# Patient Record
Sex: Male | Born: 1975 | Race: White | Hispanic: No | Marital: Married | Smoking: Never smoker
Health system: Southern US, Community
[De-identification: ages and names within clinical notes are randomized; demographics above are authoritative.]

---

## 2019-05-19 ENCOUNTER — Emergency Department: Payer: Managed Care, Other (non HMO)

## 2019-05-19 ENCOUNTER — Other Ambulatory Visit: Payer: Self-pay

## 2019-05-19 ENCOUNTER — Emergency Department
Admission: EM | Admit: 2019-05-19 | Discharge: 2019-05-19 | Disposition: A | Discharge status: Home / Self Care | Payer: Managed Care, Other (non HMO) | Attending: Emergency Medicine | Admitting: Emergency Medicine

## 2019-05-19 DIAGNOSIS — S82821A Torus fracture of lower end of right fibula, initial encounter for closed fracture: Secondary | ICD-10-CM | POA: Insufficient documentation

## 2019-05-19 DIAGNOSIS — X501XXA Overexertion from prolonged static or awkward postures, initial encounter: Secondary | ICD-10-CM | POA: Diagnosis not present

## 2019-05-19 DIAGNOSIS — R52 Pain, unspecified: Secondary | ICD-10-CM

## 2019-05-19 DIAGNOSIS — Y929 Unspecified place or not applicable: Secondary | ICD-10-CM | POA: Insufficient documentation

## 2019-05-19 DIAGNOSIS — S99911A Unspecified injury of right ankle, initial encounter: Secondary | ICD-10-CM | POA: Diagnosis present

## 2019-05-19 DIAGNOSIS — Y93K1 Activity, walking an animal: Secondary | ICD-10-CM | POA: Diagnosis not present

## 2019-05-19 DIAGNOSIS — Y999 Unspecified external cause status: Secondary | ICD-10-CM | POA: Diagnosis not present

## 2019-05-19 MED ORDER — IBUPROFEN 600 MG PO TABS
600.0000 mg | ORAL_TABLET | Freq: Once | ORAL | Status: AC
  Administered 2019-05-19: 600 mg via ORAL
  Filled 2019-05-19: qty 1

## 2019-05-19 MED ORDER — HYDROCODONE-ACETAMINOPHEN 5-325 MG PO TABS: 1.0000 | ORAL_TABLET | Freq: Four times a day (QID) | ORAL | 0 refills | Status: AC | PRN

## 2019-05-19 NOTE — ED Triage Notes (Signed)
Pt to the er for pain to the right ankle. Pt states he heard a pop and rolled the ankle underneath him.

## 2019-05-19 NOTE — ED Notes (Signed)
See triage note states he slipped and twisted right ankle  Swelling noted   Good pulses  No deformity

## 2019-05-19 NOTE — Discharge Instructions (Signed)
Follow-up with the orthopedist of choice in [place].  A prescription for pain medication was sent to your pharmacy.  You may also take ibuprofen with this medication if needed for extra pain control.  Ice and elevate often.  You will need to leave the splint on until seen by an orthopedist.  Use crutches to avoid weightbearing.

## 2019-05-19 NOTE — ED Provider Notes (Signed)
[facility] Emergency Department Provider Note  ____________________________________________   First MD Initiated Contact with Patient 05/19/19 [phone]     (approximate)  I have reviewed the triage vital signs and the nursing notes.   HISTORY  Chief Complaint Ankle Pain   HPI Bradley Jenkins is a 44 y.o. male presents to the ED with complaint of right ankle pain.  Patient states that he was walking his dogs this morning down an incline and grass was possibly wet.  He was also wearing flip-flops.  He states he slipped twisting his right ankle.  Initially he heard a pop.  Patient has been limping since this happened.  He denies any head injury or loss of consciousness during this event.  Patient rates his pain as 6 out of 10.     History reviewed. No pertinent past medical history.  There are no problems to display for this patient.   History reviewed. No pertinent surgical history.  Prior to Admission medications   Medication Sig Start Date End Date Taking? Authorizing Provider  HYDROcodone-acetaminophen (NORCO/VICODIN) 5-325 MG tablet Take 1-2 tablets by mouth every 6 (six) hours as needed for moderate pain. 05/19/19   Johnn Hai, PA-C    Allergies Patient has no allergy information on record.  No family history on file.  Social History Social History   Tobacco Use  . Smoking status: Never Smoker  . Smokeless tobacco: Never Used  Substance Use Topics  . Alcohol use: Not Currently  . Drug use: Not Currently    Review of Systems Constitutional: No fever/chills Cardiovascular: Denies chest pain. Respiratory: Denies shortness of breath. Gastrointestinal: No abdominal pain.  No nausea, no vomiting.  Musculoskeletal: Right ankle pain. Skin: Negative for rash. Neurological: Negative for headaches, focal weakness or numbness. ____________________________________________   PHYSICAL EXAM:  VITAL SIGNS: ED Triage Vitals  Enc Vitals Group      BP 05/19/19 0626 (!) 128/98     Pulse Rate 05/19/19 0626 94     Resp 05/19/19 0626 17     Temp 05/19/19 0626 98 F (36.7 C)     Temp Source 05/19/19 0626 Oral     SpO2 05/19/19 0626 98 %     Weight 05/19/19 0627 280 lb (127 kg)     Height 05/19/19 0627 6' (1.829 m)     Head Circumference --      Peak Flow --      Pain Score 05/19/19 0631 6     Pain Loc --      Pain Edu? --      Excl. in [place]? --     Constitutional: Alert and oriented. Well appearing and in no acute distress. Eyes: Conjunctivae are normal. PERRL. EOMI. Head: Atraumatic. Nose: No trauma. Neck: No stridor.   Cardiovascular: Normal rate, regular rhythm. Grossly normal heart sounds.  Good peripheral circulation. Respiratory: Normal respiratory effort.  No retractions. Lungs CTAB. Gastrointestinal: Soft and nontender. No distention. Musculoskeletal: Moves upper extremities that any difficulty.  There was no tenderness on palpation of the cervical, thoracic or lumbar spine.  No tenderness is noted to the left lower extremity.  Right ankle with moderate tenderness on palpation.  Pulses present.  No tenderness or skin discoloration noted of the foot.  Patient is able to move all digits distally without any difficulty motor sensory function intact.  Nontender patella or hip to palpation. Neurologic:  Normal speech and language. No gross focal neurologic deficits are appreciated. Skin:  Skin is warm,  dry and intact.  No abrasions, ecchymosis or erythema noted. Psychiatric: Mood and affect are normal. Speech and behavior are normal.  ____________________________________________   LABS (all labs ordered are listed, but only abnormal results are displayed)  Labs Reviewed - No data to display  RADIOLOGY   Official radiology report(s): DG Ankle Complete Right  Result Date: 05/19/2019 CLINICAL DATA:  Right ankle pain, posttraumatic EXAM: RIGHT ANKLE - COMPLETE 3+ VIEW COMPARISON:  None. FINDINGS: Branching fracture  through the distal fibula at and above the ankle joint, nondisplaced. The medial clear space is widened at nearly 5 mm. No dislocation. Generalized soft tissue swelling. IMPRESSION: 1. Nondisplaced distal fibular fracture. 2. Medial clear space widening suggesting medial ligamentous injury. Electronically Signed   By: Marnee Spring M.D.   On: 05/19/2019 07:40    ____________________________________________   PROCEDURES  Procedure(s) performed (including Critical Care):  Procedures OCL splint and crutches were applied by ED tech.   ____________________________________________   INITIAL IMPRESSION / ASSESSMENT AND PLAN / ED COURSE  As part of my medical decision making, I reviewed the following data within the electronic MEDICAL RECORD NUMBER Notes from prior ED visits and [place] Controlled Substance Database  44 year old male presents to the ED after slipping and twisting his ankle this morning while walking the dogs.  Patient his here on vacation and was planning to go back to [facility] today.  Right ankle x-ray is suspicious for a fracture.  X-ray confirms that patient has a distal fibula fracture.  Patient was placed in an OCL stirrup and posterior splint for added protection.  Crutches were supplied.  Patient is aware that he needs to ice and elevate this frequently to keep it from swelling.  A CD of his x-rays was given to him to take to the orthopedist.  Ibuprofen was given to him while in the ED as he may end up driving because there are 2 vehicles in this area that need to go back to [facility].  We discussed that this may be difficult.  The prescription for Norco was sent to his pharmacy in [place].  He is to leave the splint on until he is been seen by the orthopedist and currently wife is calling an orthopedist to get an appointment.  ____________________________________________   FINAL CLINICAL IMPRESSION(S) / ED DIAGNOSES  Final diagnoses:  Closed torus fracture of  distal end of right fibula, initial encounter     ED Discharge Orders         Ordered    HYDROcodone-acetaminophen (NORCO/VICODIN) 5-325 MG tablet  Every 6 hours PRN     05/19/19 0809           Note:  This document was prepared using Dragon voice recognition software and may include unintentional dictation errors.    Tommi Rumps, PA-C 05/19/19 1450    Emily Filbert, MD 05/19/19 1524

## 2021-01-13 IMAGING — DX DG ANKLE COMPLETE 3+V*R*
3 series · 3 of 3 positions shown · non-contrast
Comparison: None.

CLINICAL DATA: Right ankle pain, posttraumatic

EXAM:
RIGHT ANKLE - COMPLETE 3+ VIEW

[ankle ap]
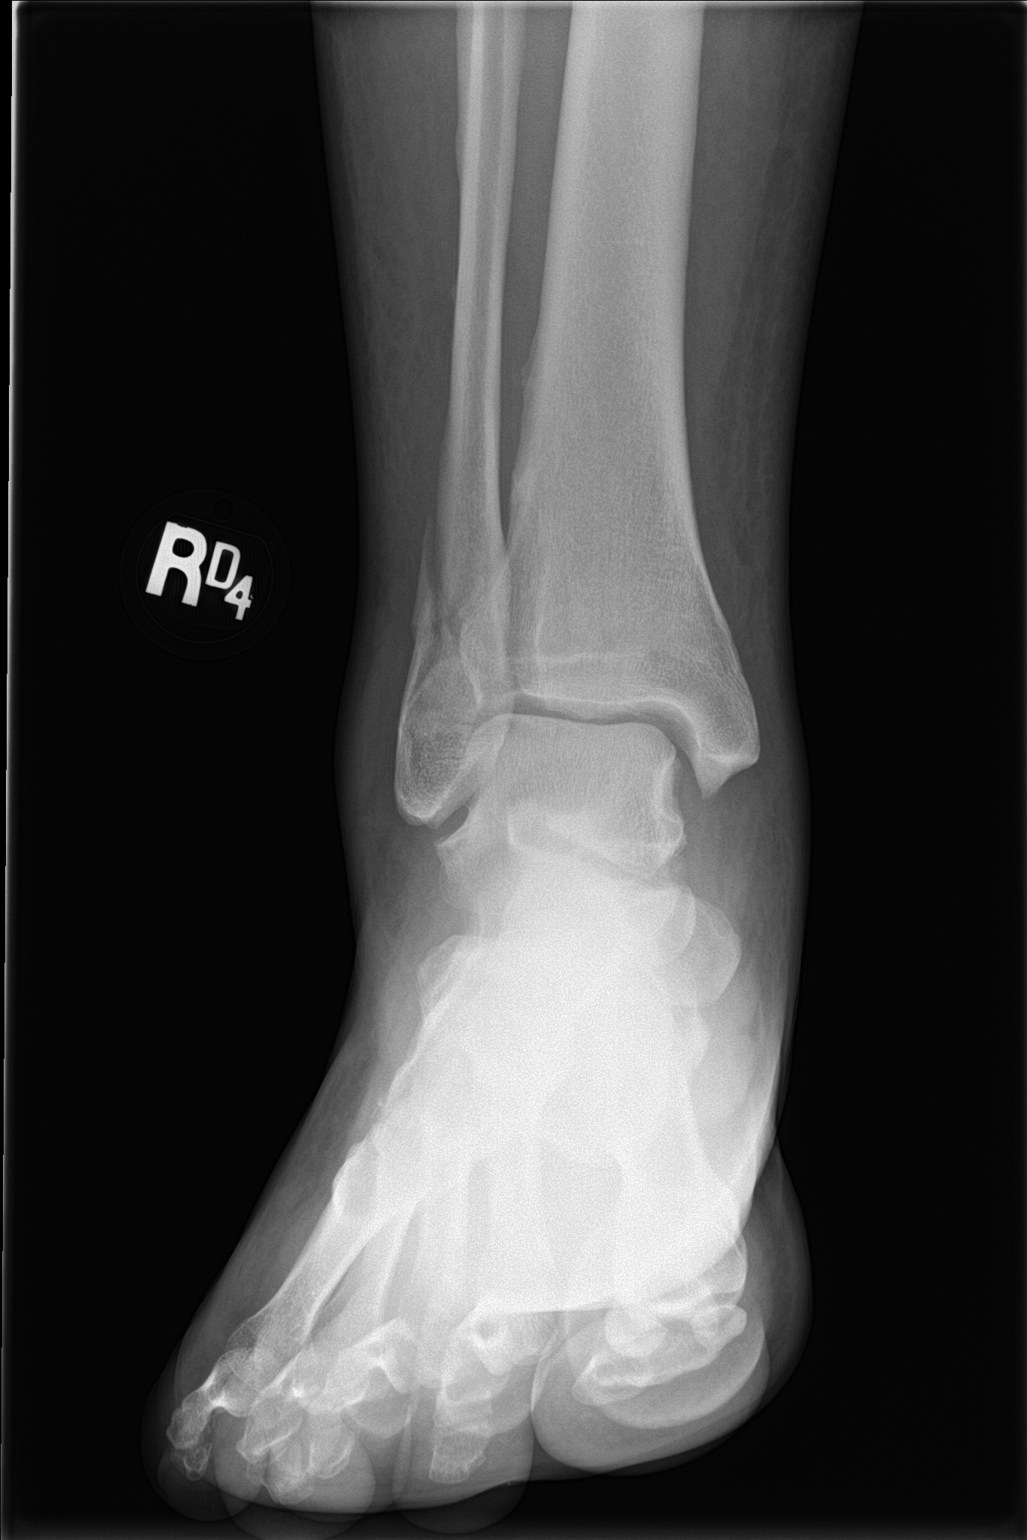

[ankle obl]
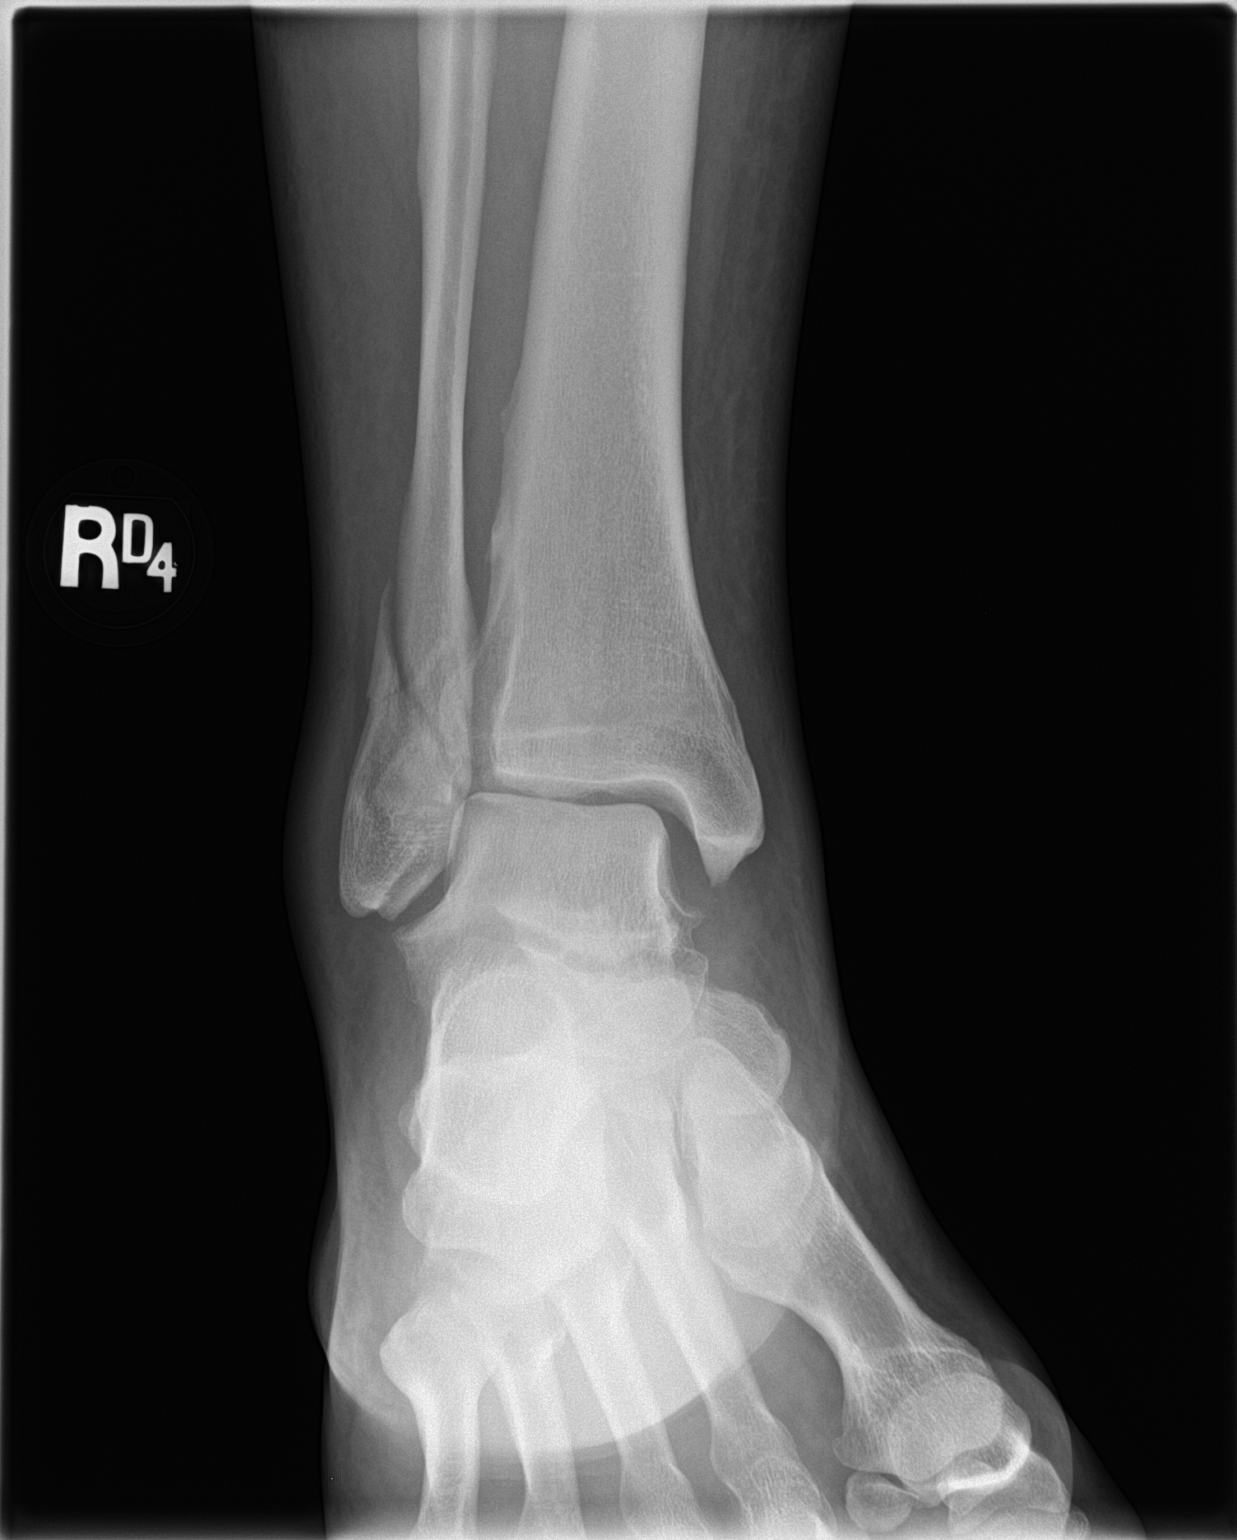

[ankle lat]
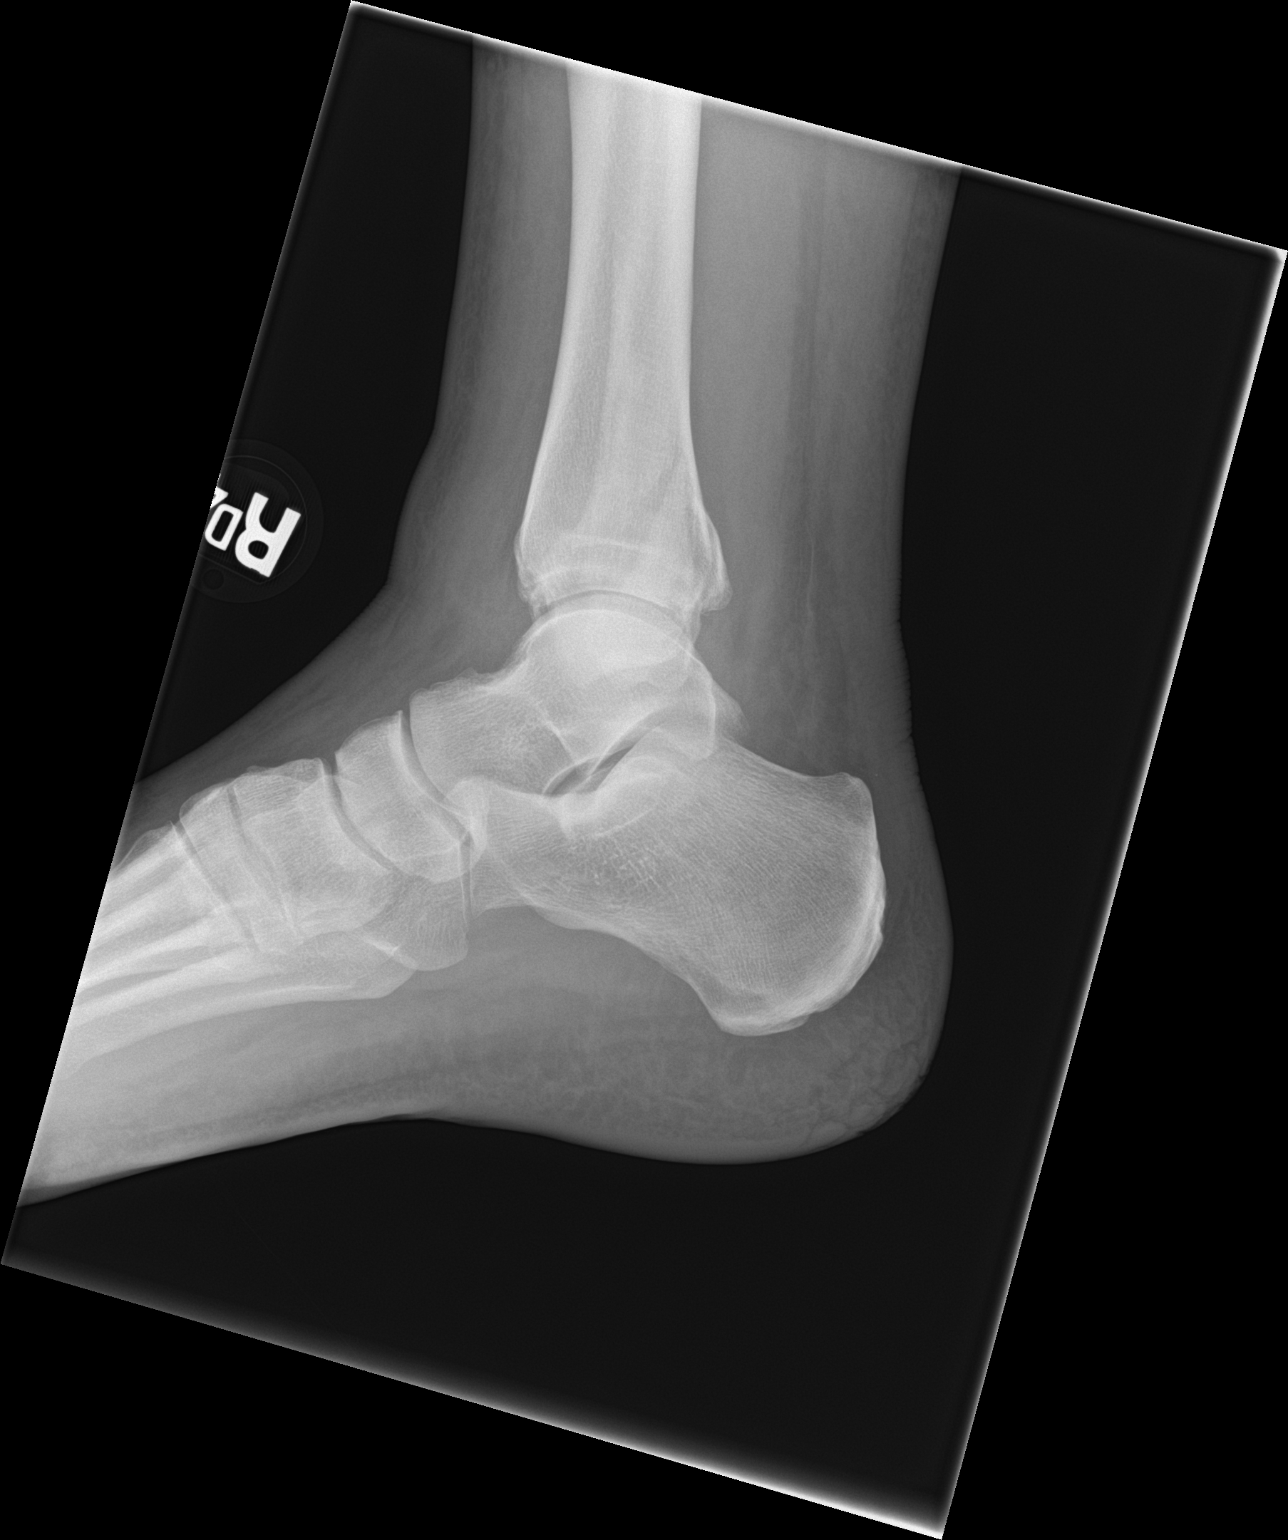

[3 of 3 positions shown; findings below may reference images not displayed]

FINDINGS: Branching fracture through the distal fibula at and above the ankle
joint, nondisplaced. The medial clear space is widened at nearly 5
mm. No dislocation. Generalized soft tissue swelling.
IMPRESSION: 1. Nondisplaced distal fibular fracture.
2. Medial clear space widening suggesting medial ligamentous injury.
